# Patient Record
Sex: Female | Born: 1985 | Race: White | Hispanic: No | Marital: Single | Smoking: Never smoker
Health system: Southern US, Community
[De-identification: ages and names within clinical notes are randomized; demographics above are authoritative.]

## PROBLEM LIST (undated history)

## (undated) DIAGNOSIS — J45909 Unspecified asthma, uncomplicated: Secondary | ICD-10-CM

---

## 2019-02-25 ENCOUNTER — Encounter (HOSPITAL_COMMUNITY): Payer: Self-pay

## 2019-02-25 ENCOUNTER — Other Ambulatory Visit: Payer: Self-pay

## 2019-02-25 ENCOUNTER — Ambulatory Visit (HOSPITAL_COMMUNITY)
Admission: EM | Admit: 2019-02-25 | Discharge: 2019-02-25 | Disposition: A | Discharge status: Home / Self Care | Payer: Self-pay | Attending: Family Medicine | Admitting: Family Medicine

## 2019-02-25 DIAGNOSIS — R509 Fever, unspecified: Secondary | ICD-10-CM | POA: Insufficient documentation

## 2019-02-25 DIAGNOSIS — J029 Acute pharyngitis, unspecified: Secondary | ICD-10-CM | POA: Insufficient documentation

## 2019-02-25 DIAGNOSIS — Z20828 Contact with and (suspected) exposure to other viral communicable diseases: Secondary | ICD-10-CM | POA: Insufficient documentation

## 2019-02-25 DIAGNOSIS — J45909 Unspecified asthma, uncomplicated: Secondary | ICD-10-CM | POA: Insufficient documentation

## 2019-02-25 DIAGNOSIS — J020 Streptococcal pharyngitis: Secondary | ICD-10-CM | POA: Insufficient documentation

## 2019-02-25 HISTORY — DX: Unspecified asthma, uncomplicated: J45.909

## 2019-02-25 LAB — POCT RAPID STREP A: Streptococcus, Group A Screen (Direct): POSITIVE — AB

## 2019-02-25 MED ORDER — ALBUTEROL SULFATE HFA 108 (90 BASE) MCG/ACT IN AERS: 2.0000 | INHALATION_SPRAY | Freq: Four times a day (QID) | RESPIRATORY_TRACT | 0 refills | Status: DC | PRN

## 2019-02-25 MED ORDER — AMOXICILLIN 500 MG PO CAPS: 1000.0000 mg | ORAL_CAPSULE | Freq: Every day | ORAL | 0 refills | Status: AC

## 2019-02-25 NOTE — ED Provider Notes (Signed)
  Caitlin Sexton    CSN: [id number] Arrival date & time: 02/25/19  1041  Chief Complaint  Patient presents with  . Fever  . Sore Throat    [facility] here for URI complaints.  Duration: 2 days  Associated symptoms: Fever (101 F), sore throat and myalgia Denies: sinus congestion, sinus pain, rhinorrhea, itchy watery eyes, ear pain, ear drainage, wheezing, shortness of breath and cough Treatment to date: Ibuprofen Sick contacts: No  ROS:  Const: +fevers HEENT: As noted in HPI Lungs: No SOB  Past Medical History:  Diagnosis Date  . Asthma     BP (!) 144/89 (BP Location: Right Arm)   Pulse 98   Temp 97.8 F (36.6 C) (Oral)   Resp 18   Wt 102.1 kg   LMP 02/25/2019   SpO2 100%  General: Awake, alert, appears stated age HEENT: AT, [place], ears patent b/l and TM's neg, nares patent w/o discharge, pharynx erythematous and with exudates, MMM Neck: No masses or asymmetry, +ttp over LN's Heart: RRR Lungs: CTAB, no accessory muscle use Psych: Age appropriate judgment and insight, normal mood and affect  Final Clinical Impressions(s) / UC Diagnoses   Final diagnoses:  Streptococcal sore throat   Discharge Instructions   None    ED Prescriptions    Medication Sig Dispense Auth. Provider   albuterol (VENTOLIN HFA) 108 (90 Base) MCG/ACT inhaler Inhale 2 puffs into the lungs every 6 (six) hours as needed for wheezing or shortness of breath. 18 g Shelda Pal, DO   amoxicillin (AMOXIL) 500 MG capsule Take 2 capsules (1,000 mg total) by mouth daily. 20 capsule Shelda Pal, DO       Riki Sheer [place], [place] 02/25/19 1158

## 2019-02-25 NOTE — ED Triage Notes (Signed)
Pt states she has had a fever and sore throat. X 2 days.

## 2019-02-26 LAB — NOVEL CORONAVIRUS, NAA (HOSP ORDER, SEND-OUT TO REF LAB; TAT 18-24 HRS): SARS-CoV-2, NAA: NOT DETECTED

## 2019-03-11 ENCOUNTER — Encounter (HOSPITAL_COMMUNITY): Payer: Self-pay | Admitting: *Deleted

## 2019-03-11 ENCOUNTER — Ambulatory Visit (HOSPITAL_COMMUNITY)
Admission: EM | Admit: 2019-03-11 | Discharge: 2019-03-11 | Disposition: A | Discharge status: Home / Self Care | Payer: Self-pay | Attending: Family Medicine | Admitting: Family Medicine

## 2019-03-11 ENCOUNTER — Other Ambulatory Visit: Payer: Self-pay

## 2019-03-11 DIAGNOSIS — R197 Diarrhea, unspecified: Secondary | ICD-10-CM | POA: Insufficient documentation

## 2019-03-11 DIAGNOSIS — J45909 Unspecified asthma, uncomplicated: Secondary | ICD-10-CM | POA: Insufficient documentation

## 2019-03-11 DIAGNOSIS — Z79899 Other long term (current) drug therapy: Secondary | ICD-10-CM | POA: Insufficient documentation

## 2019-03-11 DIAGNOSIS — Z7951 Long term (current) use of inhaled steroids: Secondary | ICD-10-CM | POA: Insufficient documentation

## 2019-03-11 DIAGNOSIS — R0789 Other chest pain: Secondary | ICD-10-CM | POA: Insufficient documentation

## 2019-03-11 DIAGNOSIS — Z20828 Contact with and (suspected) exposure to other viral communicable diseases: Secondary | ICD-10-CM | POA: Insufficient documentation

## 2019-03-11 DIAGNOSIS — J020 Streptococcal pharyngitis: Secondary | ICD-10-CM | POA: Insufficient documentation

## 2019-03-11 LAB — POCT RAPID STREP A: Streptococcus, Group A Screen (Direct): POSITIVE — AB

## 2019-03-11 MED ORDER — PENICILLIN G BENZATHINE 1200000 UNIT/2ML IM SUSP
INTRAMUSCULAR | Status: AC
  Filled 2019-03-11: qty 2

## 2019-03-11 MED ORDER — PENICILLIN G BENZATHINE 1200000 UNIT/2ML IM SUSP
1.2000 10*6.[IU] | Freq: Once | INTRAMUSCULAR | Status: AC
  Administered 2019-03-11: 16:00:00 1.2 10*6.[IU] via INTRAMUSCULAR

## 2019-03-11 NOTE — Discharge Instructions (Addendum)
Your rapid test is positive again today, we will treat this with a shot of antibiotics here today, so no further antibiotics are needed.  Push fluids to ensure adequate hydration and keep secretions thin.  Tylenol and/or ibuprofen as needed for pain or fevers.  Rest.  Again change your toothbrush in 24 hours.  Considered contagious for the next 24 hours.   Self isolate until covid results are back and negative.  Will notify you of any positive findings. You may monitor your results on your MyChart online as well.

## 2019-03-11 NOTE — ED Provider Notes (Signed)
[place]    CSN: [id number] Arrival date & time: 03/11/19  1428      History   Chief Complaint Chief Complaint  Patient presents with   Sore Throat    HPI Caitlin Sexton is a 33 y.o. female.   [facility] presents with complaints of sore throat. Finished course of amoxicillin for strep, around 10/13, after being diagnosed with strep on 10/3, was feeling better. Only about a day later, left tonsil started feeling uncomfortable again. Woke up this morning and now both tonsils are painful again. advil last at 0230 this morning. Body aches. No runny nose. Some chest tightness and cough. Has had to use her inhaler, which does help. Cough is non productive primarily. No ear pain. Has never had two consecutive cases of strep, but states she tends to get it annually and had it frequently as a child. Diarrhea, decreased appetite. States broke up with boyfriend so has had a loss of appetite. No loss of taste or smell. No known ill contacts. 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 No history of mono.    ROS per HPI, negative if not otherwise mentioned.      Past Medical History:  Diagnosis Date   Asthma     There are no active problems to display for this patient.   History reviewed. No pertinent surgical history.  OB History   No obstetric history on file.      Home Medications    Prior to Admission medications   Medication Sig Start Date End Date Taking? Authorizing Provider  albuterol (VENTOLIN HFA) 108 (90 Base) MCG/ACT inhaler Inhale 2 puffs into the lungs every 6 (six) hours as needed for wheezing or shortness of breath. 02/25/19  Yes Shelda Pal, DO  Cetirizine HCl (ZYRTEC PO) Take by mouth.   Yes [provider]  mometasone (ASMANEX) 220 MCG/INH inhaler Inhale 2 puffs into the lungs daily.   Yes [provider]  amoxicillin (AMOXIL) 500 MG  capsule Take 2 capsules (1,000 mg total) by mouth daily. 02/25/19   Shelda Pal, DO    Family History Family History  Problem Relation Age of Onset   Healthy Mother    Healthy Father     Social History Social History   Tobacco Use   Smoking status: Never Smoker   Smokeless tobacco: Never Used  Substance Use Topics   Alcohol use: Yes    Comment: socially   Drug use: Not Currently    Types: Marijuana     Allergies   Patient has no known allergies.   Review of Systems Review of Systems   Physical Exam Triage Vital Signs ED Triage Vitals  Enc Vitals Group     BP 03/11/19 1505 (!) 146/90     Pulse Rate 03/11/19 1504 (!) 118     Resp 03/11/19 1504 20     Temp 03/11/19 1504 100.1 F (37.8 C)     Temp Source 03/11/19 1504 Other     SpO2 03/11/19 1504 98 %     Weight --      Height --      Head Circumference --      Peak Flow --      Pain Score 03/11/19 1506 6     Pain Loc --      Pain Edu? --      Excl. in [place]? --  No data found.  Updated Vital Signs BP (!) 146/90    Pulse (!) 118    Temp 100.1 F (37.8 C) (Other (Comment))    Resp 20    LMP 02/25/2019 (Exact Date)    SpO2 98%    Physical Exam Constitutional:      General: She is not in acute distress.    Appearance: She is well-developed.  HENT:     Head: Normocephalic and atraumatic.     Right Ear: Tympanic membrane and ear canal normal.     Left Ear: Tympanic membrane and ear canal normal.     Mouth/Throat:     Mouth: Mucous membranes are moist.     Tonsils: Tonsillar exudate present. 2+ on the right. 2+ on the left.  Cardiovascular:     Rate and Rhythm: Tachycardia present.  Pulmonary:     Effort: Pulmonary effort is normal.  Skin:    General: Skin is warm and dry.  Neurological:     Mental Status: She is alert and oriented to person, place, and time.      UC Treatments / Results  Labs (all labs ordered are listed, but only abnormal results are displayed) Labs Reviewed   POCT RAPID STREP A - Abnormal; Notable for the following components:      Result Value   Streptococcus, Group A Screen (Direct) POSITIVE (*)    All other components within normal limits  NOVEL CORONAVIRUS, NAA (HOSP ORDER, SEND-OUT TO REF LAB; TAT 18-24 HRS)    EKG   Radiology No results found.  Procedures Procedures (including critical care time)  Medications Ordered in UC Medications  penicillin g benzathine (BICILLIN LA) 1200000 UNIT/2ML injection 1.2 Million Units (has no administration in time range)    Initial Impression / Assessment and Plan / UC Course  I have reviewed the triage vital signs and the nursing notes.  Pertinent labs & imaging results that were available during my care of the patient were reviewed by me and considered in my medical decision making (see chart for details).     Low grade temp, body aches, symptomatic of strep throat, with rapid test positive again today. Bicillin provided. Does endorse some mild cough as well, covid repeated as last was 14 days ago and negative. Return precautions provided. Patient verbalized understanding and agreeable to plan.   Final Clinical Impressions(s) / UC Diagnoses   Final diagnoses:  Strep pharyngitis     Discharge Instructions     Your rapid test is positive again today, we will treat this with a shot of antibiotics here today, so no further antibiotics are needed.  Push fluids to ensure adequate hydration and keep secretions thin.  Tylenol and/or ibuprofen as needed for pain or fevers.  Rest.  Again change your toothbrush in 24 hours.  Considered contagious for the next 24 hours.   Self isolate until covid results are back and negative.  Will notify you of any positive findings. You may monitor your results on your MyChart online as well.       ED Prescriptions    None     PDMP not reviewed this encounter.   Georgetta Haber, NP 03/11/19 1534

## 2019-03-11 NOTE — ED Triage Notes (Signed)
Treated for strep with abx course 10/3 - states completed course and felt better.  Over past couple days has felt a slight upper chest tightness and slight cough - unsure if asthma flare-up with weather change.  Since yesterday has started with tonsillar discomfort again, and body aches.

## 2019-03-12 LAB — NOVEL CORONAVIRUS, NAA (HOSP ORDER, SEND-OUT TO REF LAB; TAT 18-24 HRS): SARS-CoV-2, NAA: NOT DETECTED

## 2019-08-05 ENCOUNTER — Ambulatory Visit: Payer: Self-pay | Attending: Internal Medicine

## 2019-08-05 DIAGNOSIS — Z23 Encounter for immunization: Secondary | ICD-10-CM

## 2019-08-05 NOTE — Progress Notes (Signed)
   Covid-19 Vaccination Clinic  Name:  Shadae Reino    MRN: [id number] DOB: Sep 26, 1985  08/05/2019  Ms. Nickey was observed post Covid-19 immunization for 15 minutes without incident. She was provided with Vaccine Information Sheet and instruction to access the V-Safe system.   Ms. Sivertsen was instructed to call 911 with any severe reactions post vaccine: Marland Kitchen Difficulty breathing  . Swelling of face and throat  . A fast heartbeat  . A bad rash all over body  . Dizziness and weakness   Immunizations Administered    Name Date Dose VIS Date Route   Pfizer COVID-19 Vaccine 08/05/2019  3:30 PM 0.3 mL 05/05/2019 Intramuscular   Manufacturer: [employer], [employer]   Lot: [id number]   NDC: [address]-3

## 2019-08-29 ENCOUNTER — Ambulatory Visit: Payer: Self-pay

## 2019-09-04 ENCOUNTER — Ambulatory Visit: Payer: Self-pay | Attending: Internal Medicine

## 2019-09-04 DIAGNOSIS — Z23 Encounter for immunization: Secondary | ICD-10-CM

## 2019-09-04 NOTE — Progress Notes (Signed)
   Covid-19 Vaccination Clinic  Name:  Caitlin Sexton    MRN: [id number] DOB: 1985/12/04  09/04/2019  Ms. Caitlin Sexton was observed post Covid-19 immunization for 15 minutes without incident. She was provided with Vaccine Information Sheet and instruction to access the V-Safe system.   Ms. Caitlin Sexton was instructed to call 911 with any severe reactions post vaccine: Marland Kitchen Difficulty breathing  . Swelling of face and throat  . A fast heartbeat  . A bad rash all over body  . Dizziness and weakness   Immunizations Administered    Name Date Dose VIS Date Route   Pfizer COVID-19 Vaccine 09/04/2019 11:01 AM 0.3 mL 05/05/2019 Intramuscular   Manufacturer: [employer], [employer]   Lot: [id number]   NDC: [address]-9

## 2020-11-29 ENCOUNTER — Other Ambulatory Visit: Payer: Self-pay | Admitting: Physician Assistant

## 2020-11-29 DIAGNOSIS — R945 Abnormal results of liver function studies: Secondary | ICD-10-CM

## 2020-12-30 ENCOUNTER — Other Ambulatory Visit: Payer: Self-pay

## 2020-12-30 ENCOUNTER — Ambulatory Visit
Admission: RE | Admit: 2020-12-30 | Discharge: 2020-12-30 | Disposition: A | Discharge status: Home / Self Care | Payer: Managed Care, Other (non HMO) | Source: Ambulatory Visit | Attending: Physician Assistant | Admitting: Physician Assistant

## 2020-12-30 DIAGNOSIS — R945 Abnormal results of liver function studies: Secondary | ICD-10-CM

## 2023-02-10 IMAGING — US US ABDOMEN LIMITED
1 series · 14 of 25 positions shown · non-contrast
Comparison: None.

CLINICAL DATA: Abnormal results of liver function studies. Rule out
fatty liver.

EXAM:
ULTRASOUND ABDOMEN LIMITED RIGHT UPPER QUADRANT

[Series 1: us abdomen limited · 0.18mm/px · 14 of 47 slices shown]
[im 1/47]
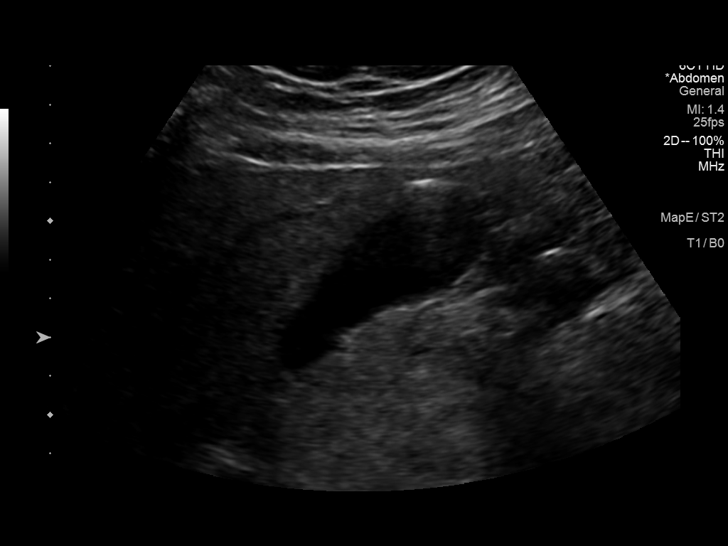
[im 4/47]
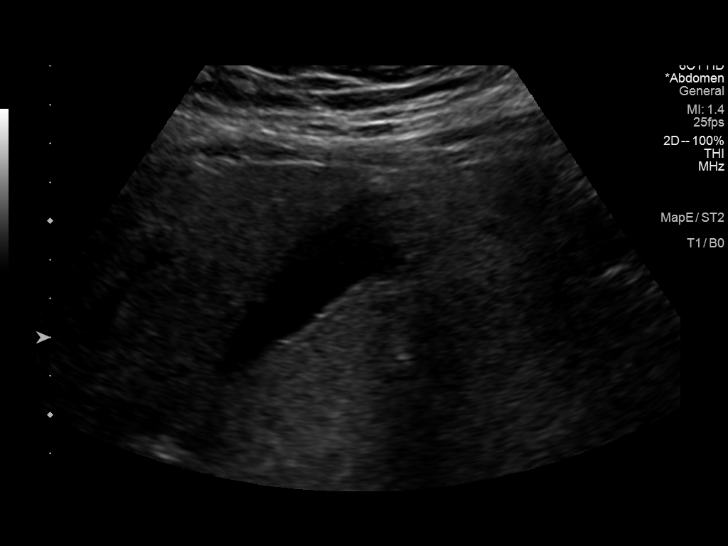
[im 8/47]
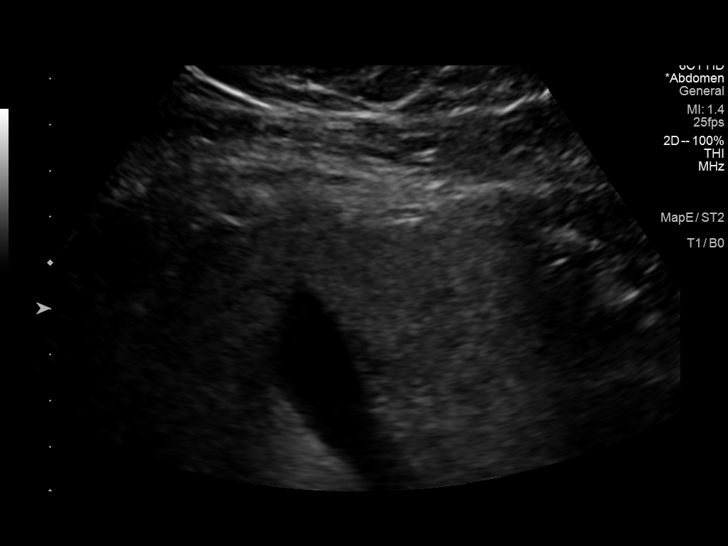
[im 12/47]
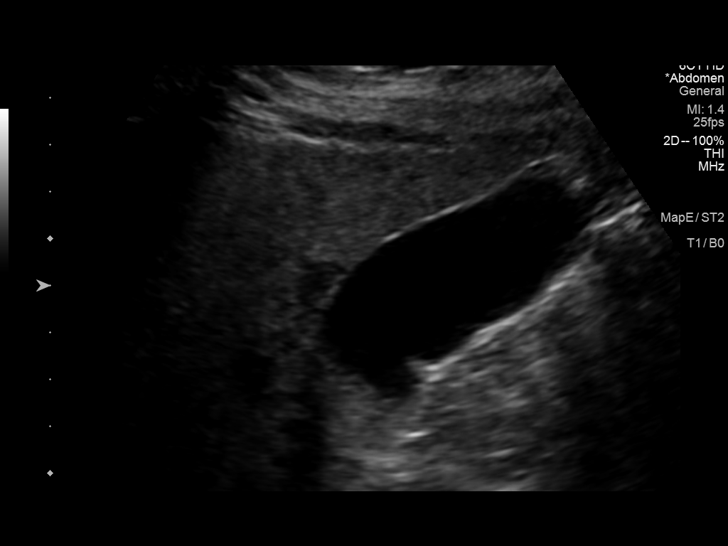
[im 16/47]
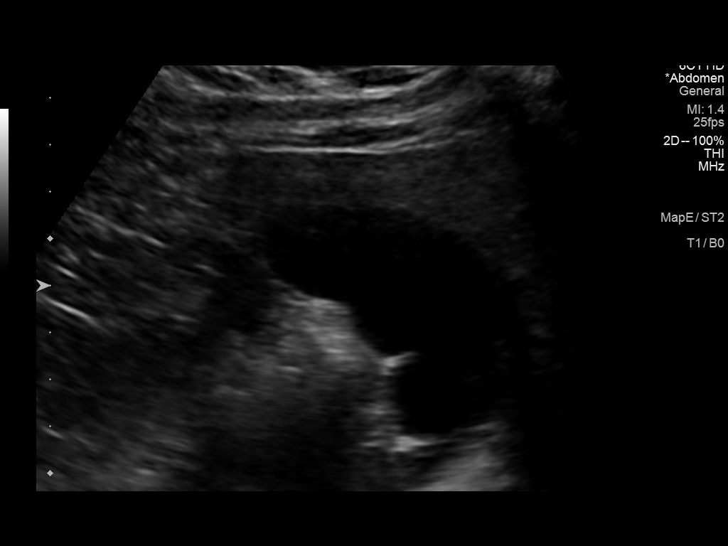
[im 18/47]
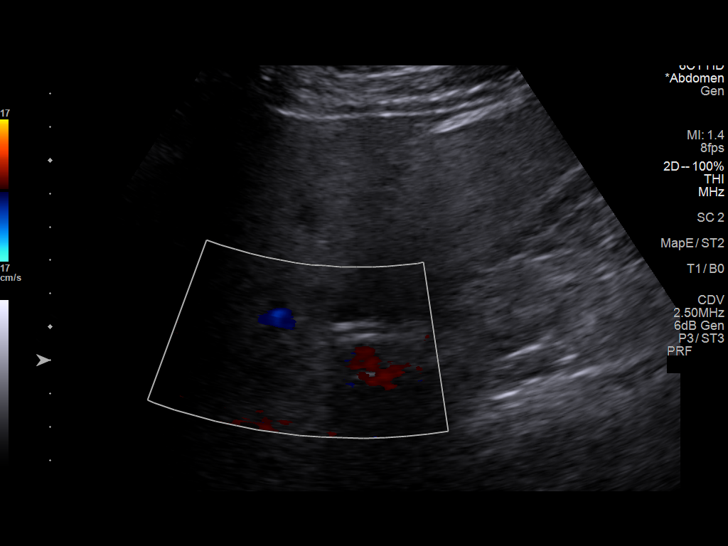
[im 22/47]
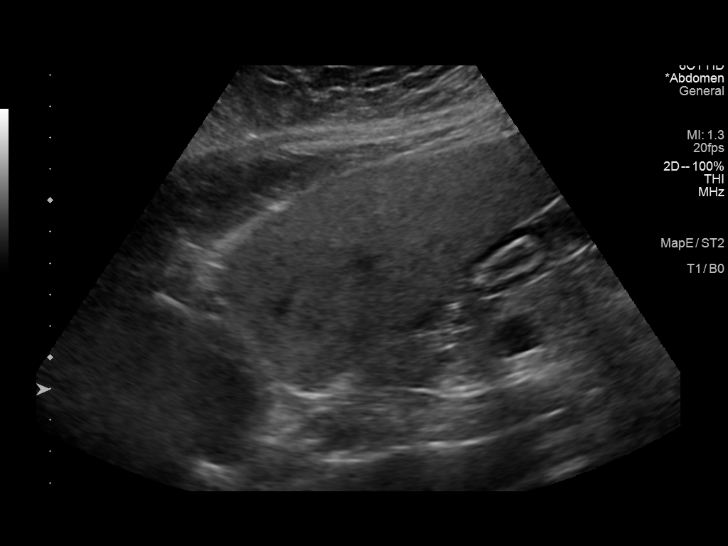
[im 25/47]
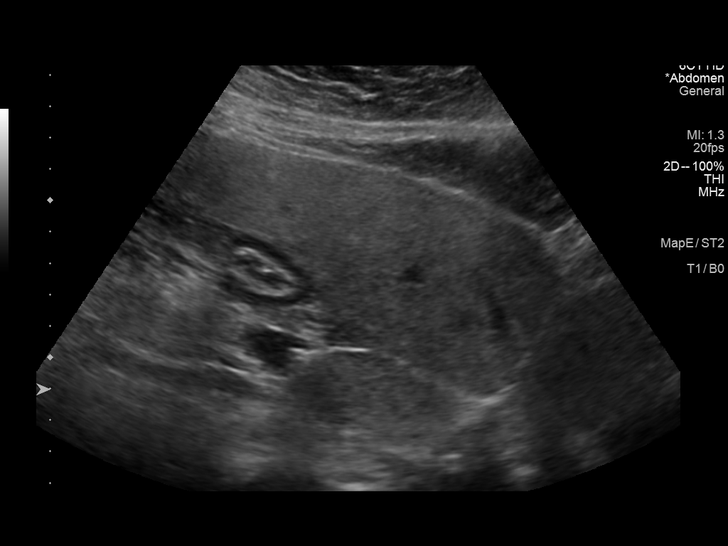
[im 29/47]
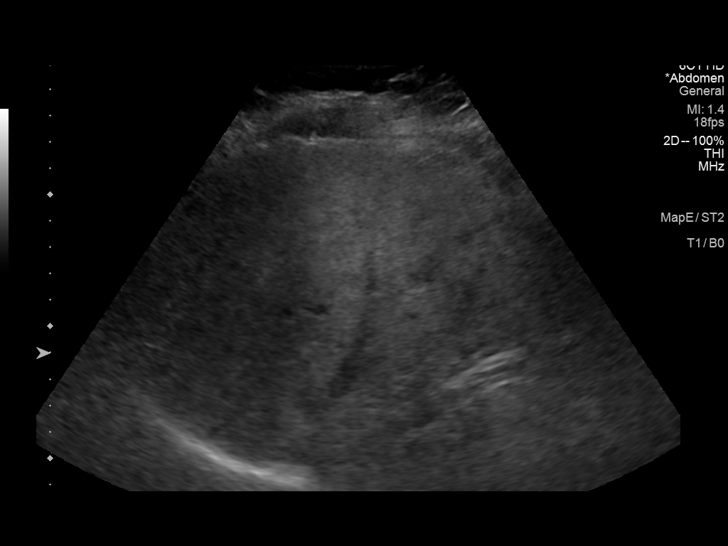
[im 31/47]
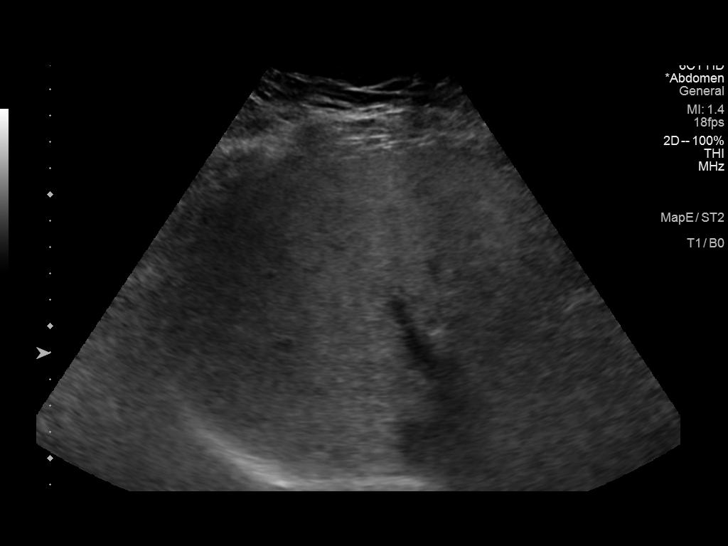
[im 35/47]
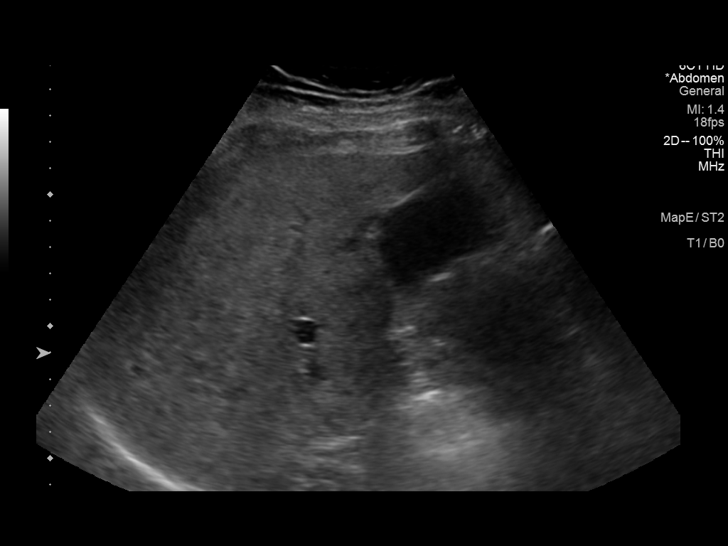
[im 39/47]
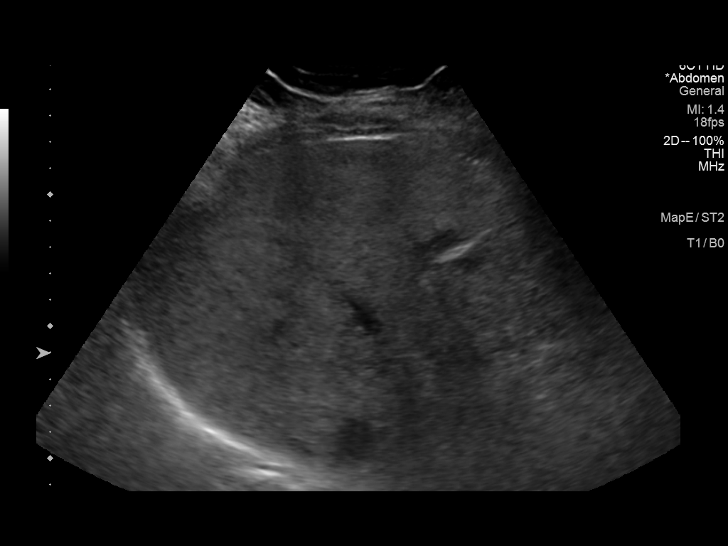
[im 43/47]
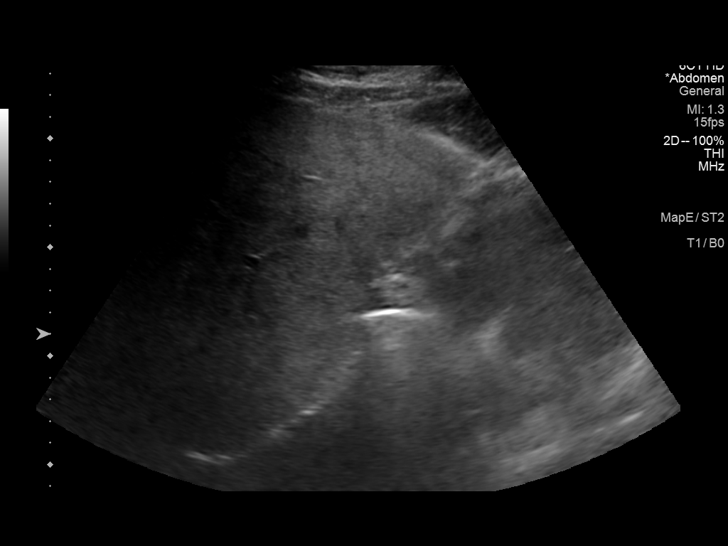
[im 47/47]
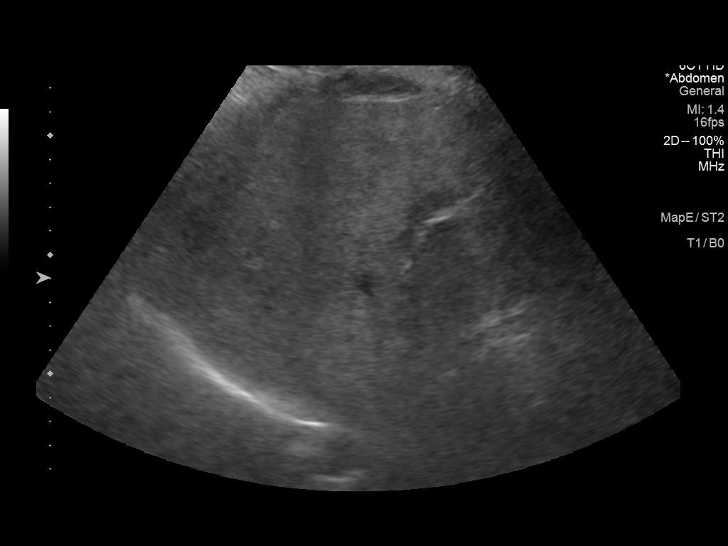

[14 of 25 positions shown; findings below may reference images not displayed]

FINDINGS: Gallbladder:

Physiologically distended. No gallstones or wall thickening
visualized. No sonographic Murphy sign noted by sonographer.

Common bile duct:

Diameter: 2-3 mm, normal.

Liver:

Heterogeneous increased parenchymal echogenicity. There are areas of
focal fatty sparing adjacent to the gallbladder fossa. No discrete
focal lesion. No capsular nodularity. Portal vein is patent on color
Doppler imaging with normal direction of blood flow towards the
liver.

Other: No right upper quadrant ascites.
IMPRESSION: 1. Heterogeneous increased hepatic parenchymal echogenicity typical
of steatosis.
2. Normal sonographic appearance of the gallbladder and biliary
tree.

## 2023-06-27 ENCOUNTER — Ambulatory Visit
Admission: EM | Admit: 2023-06-27 | Discharge: 2023-06-27 | Disposition: A | Discharge status: Home / Self Care | Payer: Managed Care, Other (non HMO) | Attending: Family Medicine | Admitting: Family Medicine

## 2023-06-27 DIAGNOSIS — J4530 Mild persistent asthma, uncomplicated: Secondary | ICD-10-CM

## 2023-06-27 DIAGNOSIS — J111 Influenza due to unidentified influenza virus with other respiratory manifestations: Secondary | ICD-10-CM

## 2023-06-27 LAB — POC COVID19/FLU A&B COMBO
Covid Antigen, POC: NEGATIVE
Influenza A Antigen, POC: NEGATIVE
Influenza B Antigen, POC: NEGATIVE

## 2023-06-27 MED ORDER — ACETAMINOPHEN 325 MG PO TABS
650.0000 mg | ORAL_TABLET | Freq: Once | ORAL | Status: AC
  Administered 2023-06-27: 650 mg via ORAL

## 2023-06-27 MED ORDER — OSELTAMIVIR PHOSPHATE 75 MG PO CAPS: 75.0000 mg | ORAL_CAPSULE | Freq: Two times a day (BID) | ORAL | 0 refills | Status: AC

## 2023-06-27 MED ORDER — ALBUTEROL SULFATE HFA 108 (90 BASE) MCG/ACT IN AERS: 1.0000 | INHALATION_SPRAY | Freq: Four times a day (QID) | RESPIRATORY_TRACT | 0 refills | Status: AC | PRN

## 2023-06-27 MED ORDER — PROMETHAZINE-DM 6.25-15 MG/5ML PO SYRP: 5.0000 mL | ORAL_SOLUTION | Freq: Three times a day (TID) | ORAL | 0 refills | Status: AC | PRN

## 2023-06-27 MED ORDER — PREDNISONE 20 MG PO TABS: ORAL_TABLET | ORAL | 0 refills | Status: AC

## 2023-06-27 NOTE — ED Provider Notes (Signed)
[facility]  Note:  This document was prepared using Conservation officer, historic buildings and may include unintentional dictation errors.  MRN: [id number] DOB: Sep 26, 1985  Subjective:   Caitlin Sexton is a 38 y.o. female with PMH of moderate persistent asthma presenting for 1 day history of acute onset fever, body pains, chills, headaches, chest tightness.  Has been using ibuprofen.  Needs a refill of her albuterol inhaler.  Did a COVID test at home and was negative.  No smoking of any kind including cigarettes, cigars, vaping, marijuana use.    No current facility-administered medications for this encounter.  Current Outpatient Medications:    busPIRone (BUSPAR) 10 MG tablet, Take 10 mg by mouth 2 (two) times daily., Disp: , Rfl:    fluticasone (FLONASE) 50 MCG/ACT nasal spray, Place into both nostrils daily., Disp: , Rfl:    ibuprofen (ADVIL) 200 MG tablet, Take 200 mg by mouth every 6 (six) hours as needed., Disp: , Rfl:    albuterol (VENTOLIN HFA) 108 (90 Base) MCG/ACT inhaler, Inhale 2 puffs into the lungs every 6 (six) hours as needed for wheezing or shortness of breath., Disp: 18 g, Rfl: 0   amoxicillin (AMOXIL) 500 MG capsule, Take 2 capsules (1,000 mg total) by mouth daily., Disp: 20 capsule, Rfl: 0   Cetirizine HCl (ZYRTEC PO), Take by mouth., Disp: , Rfl:    escitalopram (LEXAPRO) 20 MG tablet, Take 20 mg by mouth daily., Disp: , Rfl:    mometasone (ASMANEX) 220 MCG/INH inhaler, Inhale 2 puffs into the lungs daily., Disp: , Rfl:    Allergies  Allergen Reactions   Venlafaxine Other (See Comments)    Other Reaction: FATIGUE    Past Medical History:  Diagnosis Date   Asthma      History reviewed. No pertinent surgical history.  Family History  Problem Relation Age of Onset   Healthy Mother    Healthy Father     Social History   Tobacco Use   Smoking status: Never   Smokeless tobacco: Never  Vaping Use   Vaping status: Never Used  Substance  Use Topics   Alcohol use: Not Currently    Comment: socially   Drug use: Not Currently    Types: Marijuana    Comment: Occa    ROS   Objective:   Vitals: BP (!) 142/85 (BP Location: Left Arm)   Pulse (!) 108   Temp (!) 101.2 F (38.4 C) (Oral)   Resp 18   LMP  (Within Weeks) Comment: 3 weeks  SpO2 94%   Physical Exam Constitutional:      General: She is not in acute distress.    Appearance: Normal appearance. She is well-developed and normal weight. She is not ill-appearing, toxic-appearing or diaphoretic.  HENT:     Head: Normocephalic and atraumatic.     Right Ear: Tympanic membrane, ear canal and external ear normal. No drainage or tenderness. No middle ear effusion. There is no impacted cerumen. Tympanic membrane is not erythematous or bulging.     Left Ear: Tympanic membrane, ear canal and external ear normal. No drainage or tenderness.  No middle ear effusion. There is no impacted cerumen. Tympanic membrane is not erythematous or bulging.     Nose: Nose normal. No congestion or rhinorrhea.     Mouth/Throat:     Mouth: Mucous membranes are moist. No oral lesions.     Pharynx: No pharyngeal swelling, oropharyngeal exudate, posterior oropharyngeal erythema or uvula swelling.  Tonsils: No tonsillar exudate or tonsillar abscesses.  Eyes:     General: No scleral icterus.       Right eye: No discharge.        Left eye: No discharge.     Extraocular Movements: Extraocular movements intact.     Right eye: Normal extraocular motion.     Left eye: Normal extraocular motion.     Conjunctiva/sclera: Conjunctivae normal.  Cardiovascular:     Rate and Rhythm: Normal rate and regular rhythm.     Heart sounds: Normal heart sounds. No murmur heard.    No friction rub. No gallop.  Pulmonary:     Effort: Pulmonary effort is normal. No respiratory distress.     Breath sounds: No stridor. No wheezing, rhonchi or rales.  Chest:     Chest wall: No tenderness.  Musculoskeletal:      Cervical back: Normal range of motion and neck supple.  Lymphadenopathy:     Cervical: No cervical adenopathy.  Skin:    General: Skin is warm and dry.  Neurological:     General: No focal deficit present.     Mental Status: She is alert and oriented to person, place, and time.  Psychiatric:        Mood and Affect: Mood normal.        Behavior: Behavior normal.     Results for orders placed or performed during the hospital encounter of 06/27/23 (from the past 24 hours)  POC Covid19/Flu A&B Antigen     Status: None   Collection Time: 06/27/23 10:40 AM  Result Value Ref Range   Influenza A Antigen, POC Negative Negative   Influenza B Antigen, POC Negative Negative   Covid Antigen, POC Negative Negative    Assessment and Plan :   PDMP not reviewed this encounter.  1. Influenza   2. Mild persistent asthma, uncomplicated    Suspect a false negative.  Recommended treating for influenza empirically with Tamiflu.  Also recommend an oral prednisone course, albuterol refill provided.  Use supportive care.  Deferred imaging given clear cardiopulmonary exam, hemodynamically stable vital signs.  Counseled patient on potential for adverse effects with medications prescribed/recommended today, ER and return-to-clinic precautions discussed, patient verbalized understanding.    Wallis Bamberg, [place] 06/27/23 0454

## 2023-06-27 NOTE — ED Triage Notes (Signed)
Pt reports fever, headache, chest tightness, cough, chills, body aches x 1 day. Ibuprofen gives some relief.   Negative COVID test at home.

## 2023-06-27 NOTE — Discharge Instructions (Signed)
Start Tamiflu for a clinical diagnosis of Influenza. Start prednisone and albuterol for a respiratory boost. Use Tylenol for aches, pains, cough syrup as needed.
